# Patient Record
Sex: Male | Born: 1995 | Race: White | Hispanic: No | Smoking: Never smoker
Health system: Southern US, Community
[De-identification: ages and names within clinical notes are randomized; demographics above are authoritative.]

---

## 2015-12-08 ENCOUNTER — Emergency Department (HOSPITAL_COMMUNITY)
Admission: EM | Admit: 2015-12-08 | Discharge: 2015-12-08 | Disposition: A | Discharge status: Home / Self Care | Payer: Self-pay | Attending: Emergency Medicine | Admitting: Emergency Medicine

## 2015-12-08 ENCOUNTER — Encounter (HOSPITAL_COMMUNITY): Payer: Self-pay

## 2015-12-08 DIAGNOSIS — J039 Acute tonsillitis, unspecified: Secondary | ICD-10-CM | POA: Insufficient documentation

## 2015-12-08 MED ORDER — AMOXICILLIN 500 MG PO CAPS
500.0000 mg | ORAL_CAPSULE | Freq: Three times a day (TID) | ORAL | Status: DC
Start: 2015-12-08 — End: 2016-08-07

## 2015-12-08 MED ORDER — MAGIC MOUTHWASH W/LIDOCAINE: 5.0000 mL | Freq: Three times a day (TID) | ORAL | Status: DC | PRN

## 2015-12-08 NOTE — ED Notes (Signed)
Pt alert & oriented x4, stable gait. Patient given discharge instructions, paperwork & prescription(s). Patient  instructed to stop at the registration desk to finish any additional paperwork. Patient verbalized understanding. Pt left department w/ no further questions. 

## 2015-12-08 NOTE — ED Notes (Signed)
Pt reports has had a knot in his throat for the past 2 or 3 days that is painful with swallowing.  Denies fever.

## 2015-12-08 NOTE — Discharge Instructions (Signed)
Tonsillitis °Tonsillitis is an infection of the throat. This infection causes the tonsils to become red, tender, and puffy (swollen). Tonsils are groups of tissue at the back of your throat. If bacteria caused your infection, antibiotic medicine will be given to you. Sometimes symptoms of tonsillitis can be relieved with the use of steroid medicine. If your tonsillitis is severe and happens often, you may need to get your tonsils removed (tonsillectomy). °HOME CARE  °· Rest and sleep often. °· Drink enough fluids to keep your pee (urine) clear or pale yellow. °· While your throat is sore, eat soft or liquid foods like: °¨ Soup. °¨ Ice cream. °¨ Instant breakfast drinks. °· Eat frozen ice pops. °· Gargle with a warm or cold liquid to help soothe the throat. Gargle with a water and salt mix. Mix 1/4 teaspoon of salt and 1/4 teaspoon of baking soda in 1 cup of water. °· Only take medicines as told by your doctor. °· If you are given medicines (antibiotics), take them as told. Finish them even if you start to feel better. °GET HELP IF: °· You have large, tender lumps in your neck. °· You have a rash. °· You cough up green, yellow-brown, or bloody fluid. °· You cannot swallow liquids or food for 24 hours. °· You notice that only one of your tonsils is swollen. °GET HELP RIGHT AWAY IF:  °· You throw up (vomit). °· You have a very bad headache. °· You have a stiff neck. °· You have chest pain. °· You have trouble breathing or swallowing. °· You have bad throat pain, drooling, or your voice changes. °· You have bad pain not helped by medicine. °· You cannot fully open your mouth. °· You have redness, puffiness, or bad pain in the neck. °· You have a fever. °MAKE SURE YOU:  °· Understand these instructions. °· Will watch your condition. °· Will get help right away if you are not doing well or get worse. °  °This information is not intended to replace advice given to you by your health care provider. Make sure you discuss any  questions you have with your health care provider. °  °Document Released: 04/15/2008 Document Revised: 11/02/2013 Document Reviewed: 04/16/2013 °Elsevier Interactive Patient Education ©2016 [employer] ° °

## 2015-12-08 NOTE — ED Provider Notes (Signed)
CSN: [id number]     Arrival date & time 12/08/15  1241 History   First MD Initiated Contact with Patient 12/08/15 1249     Chief Complaint  Patient presents with  . lump in throat      (Consider location/radiation/quality/duration/timing/severity/associated sxs/prior Treatment) HPI   Steven Woodward is a 20 y.o. male who presents to the Emergency Department complaining of planing of sore throat and "a knot on the side of my right throat."  He states symptoms have been present for 2 days. He states he only has pain with swallowing and trying to eat food.  He denies ear pain, cough or nasal congestion, abdominal pain, chills or fever. He has not tried any medications for symptom relief. He denies known strep exposure.   History reviewed. No pertinent past medical history. History reviewed. No pertinent past surgical history. No family history on file. Social History  Substance Use Topics  . Smoking status: Never Smoker   . Smokeless tobacco: None  . Alcohol Use: No    Review of Systems  Constitutional: Negative for fever, chills, activity change and appetite change.  HENT: Positive for sore throat. Negative for congestion, ear pain, facial swelling, trouble swallowing and voice change.   Eyes: Negative for pain and visual disturbance.  Respiratory: Negative for cough and shortness of breath.   Gastrointestinal: Negative for nausea, vomiting and abdominal pain.  Musculoskeletal: Negative for arthralgias, neck pain and neck stiffness.  Skin: Negative for color change and rash.  Neurological: Negative for dizziness, facial asymmetry, speech difficulty, numbness and headaches.  Hematological: Positive for adenopathy.  All other systems reviewed and are negative.     Allergies  Review of patient's allergies indicates no known allergies.  Home Medications   Prior to Admission medications   Medication Sig Start Date End Date Taking? Authorizing Provider  amoxicillin (AMOXIL) 500  MG capsule Take 1 capsule (500 mg total) by mouth 3 (three) times daily. 12/08/15   Ranard Harte, PA-C  magic mouthwash w/lidocaine SOLN Take 5 mLs by mouth 3 (three) times daily as needed for mouth pain. Swish and spit, do not swallow 12/08/15   Stewart Sasaki, PA-C   Pulse 88  Temp(Src) 97.6 F (36.4 C) (Oral)  Resp 12  Ht  (1.778 m)  Wt 54.432 kg  BMI 17.22 kg/m2  SpO2 100% Physical Exam  Constitutional: He is oriented to person, place, and time. He appears well-developed and well-nourished. No distress.  HENT:  Head: Normocephalic and atraumatic.  Right Ear: Tympanic membrane and ear canal normal.  Left Ear: Tympanic membrane and ear canal normal.  Mouth/Throat: Uvula is midline and mucous membranes are normal. No trismus in the jaw. No uvula swelling. Oropharyngeal exudate and posterior oropharyngeal erythema present. No posterior oropharyngeal edema or tonsillar abscesses.  Uvula is midline, no edema or bulging of the soft palate.  Neck: Normal range of motion. Neck supple.  Cardiovascular: Normal rate, regular rhythm and normal heart sounds.   No murmur heard. Pulmonary/Chest: Effort normal and breath sounds normal.  Abdominal: Soft. There is no splenomegaly. There is no tenderness.  Musculoskeletal: Normal range of motion.  Lymphadenopathy:    He has cervical adenopathy.  Neurological: He is alert and oriented to person, place, and time. He exhibits normal muscle tone. Coordination normal.  Skin: Skin is warm and dry.  Nursing note and vitals reviewed.   ED Course  Procedures (including critical care time) Labs Review Labs Reviewed - No data to display  Imaging Review  No results found. I have personally reviewed and evaluated these images and lab results as part of my medical decision-making.   EKG Interpretation None      MDM   Final diagnoses:  Tonsillitis    Patient is well-appearing. Vital signs are stable. Airway is patent without edema. He  handles secretions well. There are exudates present on the right tonsil with right cervical lymphadenopathy. No concerning symptoms for peritonsillar abscess.  Symptoms are likely related to tonsillitis, he agrees to treatment with amoxicillin and magic mouthwash. Advised to return for any worsening symptoms, he appears stable for discharge.    Pauline Aus, PA-C 12/08/15 1647  Benjiman Core, MD 12/11/15 [phone]

## 2016-08-07 ENCOUNTER — Emergency Department (HOSPITAL_COMMUNITY): Payer: Self-pay

## 2016-08-07 ENCOUNTER — Emergency Department (HOSPITAL_COMMUNITY)
Admission: EM | Admit: 2016-08-07 | Discharge: 2016-08-07 | Disposition: A | Discharge status: Home / Self Care | Payer: Self-pay | Attending: Emergency Medicine | Admitting: Emergency Medicine

## 2016-08-07 ENCOUNTER — Encounter (HOSPITAL_COMMUNITY): Payer: Self-pay

## 2016-08-07 DIAGNOSIS — N503 Cyst of epididymis: Secondary | ICD-10-CM | POA: Insufficient documentation

## 2016-08-07 DIAGNOSIS — N50812 Left testicular pain: Secondary | ICD-10-CM | POA: Insufficient documentation

## 2016-08-07 LAB — URINALYSIS, ROUTINE W REFLEX MICROSCOPIC
BILIRUBIN URINE: NEGATIVE
GLUCOSE, UA: NEGATIVE mg/dL
HGB URINE DIPSTICK: NEGATIVE
KETONES UR: NEGATIVE mg/dL
LEUKOCYTES UA: NEGATIVE
Nitrite: NEGATIVE
PH: 6 (ref 5.0–8.0)
PROTEIN: NEGATIVE mg/dL
Specific Gravity, Urine: 1.02 (ref 1.005–1.030)

## 2016-08-07 NOTE — ED Notes (Signed)
US aware of pt. Will be her in about 20/25 minutes.

## 2016-08-07 NOTE — ED Provider Notes (Signed)
[facility] Provider Note   CSN: [id number] Arrival date & time: 08/07/16  1825     History   Chief Complaint Chief Complaint  Patient presents with  . Testicle Pain    HPI Steven Woodward is a 20 y.o. male.  HPI 20 year old male who presents with presents with left testicular pain. Pain ongoing for one month, comes and goes. Typically aching/throbbing in left testicle lasting 1-10 minutes. No aggravating or alleviating factors. No inciting factors. No associated with dysuria, hematuria, abnormal penile discharge, abd pain, nausea or vomiting. No trauma. Not associated with position changes. Has not tried medications for this.   History reviewed. No pertinent past medical history.  There are no active problems to display for this patient.   History reviewed. No pertinent surgical history.     Home Medications    Prior to Admission medications   Not on File    Family History No family history on file. Reviewed. noncontributory Social History Social History  Substance Use Topics  . Smoking status: Never Smoker  . Smokeless tobacco: Never Used  . Alcohol use No     Allergies   Review of patient's allergies indicates no known allergies.   Review of Systems Review of Systems 10/14 systems reviewed and are negative other than those stated in the HPI   Physical Exam Updated Vital Signs BP 142/81 (BP Location: Left Arm)   Pulse 87   Temp 98.2 F (36.8 C) (Oral)   Resp 20   Ht 5\' 10"  (1.778 m)   Wt 125 lb (56.7 kg)   SpO2 100%   BMI 17.94 kg/m   Physical Exam Physical Exam  Nursing note and vitals reviewed. Constitutional: Well developed, well nourished, non-toxic, and in no acute distress Head: Normocephalic and atraumatic.  Mouth/Throat: Oropharynx is clear and moist.  Neck: Normal range of motion. Neck supple.  Cardiovascular: Normal rate and regular rhythm.   Pulmonary/Chest: Effort normal and breath sounds normal.  Abdominal: Soft.  There is no tenderness. There is no rebound and no guarding.  GU: normal testicular lie w/o swelling, normal cremsteric reflexes bilaterally, no lesions or penile discharge Musculoskeletal: Normal range of motion.  Neurological: Alert, no facial droop, fluent speech, moves all extremities symmetrically Skin: Skin is warm and dry.  Psychiatric: Cooperative   ED Treatments / Results  Labs (all labs ordered are listed, but only abnormal results are displayed) Labs Reviewed  URINALYSIS, ROUTINE W REFLEX MICROSCOPIC ([facility])    EKG  EKG Interpretation None       Radiology [place] Scrotum  Result Date: 08/07/2016 CLINICAL DATA:  Intermittent left scrotal pain over the last 1 month. EXAM: ULTRASOUND OF SCROTUM TECHNIQUE: Complete ultrasound examination of the testicles, epididymis, and other scrotal structures was performed. COMPARISON:  None. FINDINGS: Right testicle Measurements: 4.3 x 1.8 x 3.1 cm, within normal limits. No mass or microlithiasis visualized. Left testicle Measurements: 4.1 x 2.5 x 2.3 cm, within normal limits. No mass or microlithiasis visualized. Right epididymis:  Normal in size and appearance. Left epididymis: A benign-appearing epididymal cyst measures 1.2 cm maximally. The left epididymis is otherwise within normal limits. Hydrocele:  None visualized. Varicocele:  None visualized. IMPRESSION: Negative. No evidence for testicular mass or other significant abnormality. Benign-appearing left epididymal cyst versus spermatocele. This is unlikely to be of clinical consequence to the patient. Electronically Signed   By: Marin Robertshristopher  Mattern M.D.   On: 08/07/2016 20:13   [place] Art/ven Flow Abd Pelv Doppler  Result  Date: 08/07/2016 CLINICAL DATA:  Intermittent left scrotal pain over the last 1 month. EXAM: ULTRASOUND OF SCROTUM TECHNIQUE: Complete ultrasound examination of the testicles, epididymis, and other scrotal structures was performed. COMPARISON:  None. FINDINGS: Right  testicle Measurements: 4.3 x 1.8 x 3.1 cm, within normal limits. No mass or microlithiasis visualized. Left testicle Measurements: 4.1 x 2.5 x 2.3 cm, within normal limits. No mass or microlithiasis visualized. Right epididymis:  Normal in size and appearance. Left epididymis: A benign-appearing epididymal cyst measures 1.2 cm maximally. The left epididymis is otherwise within normal limits. Hydrocele:  None visualized. Varicocele:  None visualized. IMPRESSION: Negative. No evidence for testicular mass or other significant abnormality. Benign-appearing left epididymal cyst versus spermatocele. This is unlikely to be of clinical consequence to the patient. Electronically Signed   By: Marin Roberts M.D.   On: 08/07/2016 20:13    Procedures Procedures (including critical care time)  Medications Ordered in ED Medications - No data to display   Initial Impression / Assessment and Plan / ED Course  I have reviewed the triage vital signs and the nursing notes.  Pertinent labs & imaging results that were available during my care of the patient were reviewed by me and considered in my medical decision making (see chart for details).  Clinical Course    Presenting with intermittent left testicular pain for 1 month. Currently symptomatic. With normal GU exam and benign abdomen. Vital signs within normal limits. He has normal urine. Ultrasound with Doppler reveals left epididymal cyst. There is no evidence of epididymitis, torsion, gastric, or any other serious processes. Given urology follow-up as needed. Strict return and follow-up instructions reviewed. He expressed understanding of all discharge instructions and felt comfortable with the plan of care.   Final Clinical Impressions(s) / ED Diagnoses   Final diagnoses:  Left testicular pain  Epididymal cyst    New Prescriptions New Prescriptions   No medications on file     Lavera Guise, MD 08/07/16 2022

## 2016-08-07 NOTE — ED Triage Notes (Signed)
Patient reports of left testicle pain x1 month. States he was looking on internet and decided to get check. Denies new pain or change. Denies discoloration.

## 2016-08-07 NOTE — Discharge Instructions (Signed)
You have a cyst on the left testicle. Please follow-up with urology for persistent symptoms. Return to Ed without fail for worsening symptoms, including escalating or severe pain, scrotal swelling or discoloration, inability to urinate or any other symptoms concerning to you.

## 2017-02-06 ENCOUNTER — Emergency Department (HOSPITAL_COMMUNITY): Payer: Self-pay

## 2017-02-06 ENCOUNTER — Encounter (HOSPITAL_COMMUNITY): Payer: Self-pay

## 2017-02-06 ENCOUNTER — Emergency Department (HOSPITAL_COMMUNITY)
Admission: EM | Admit: 2017-02-06 | Discharge: 2017-02-06 | Disposition: A | Discharge status: Home / Self Care | Payer: Self-pay | Attending: Emergency Medicine | Admitting: Emergency Medicine

## 2017-02-06 DIAGNOSIS — M654 Radial styloid tenosynovitis [de Quervain]: Secondary | ICD-10-CM | POA: Insufficient documentation

## 2017-02-06 NOTE — Discharge Instructions (Signed)
Wear the brace for at least one week.  Take ibuprofen 600 mg 3 times a day for 5-7 days.  Call the orthopedic doctor listed to arrange a follow-up appt in one week if not improving

## 2017-02-06 NOTE — ED Triage Notes (Signed)
Pt complaining of right wrsit pain for 5 days. No swelling. Complains of pain with movement of thumb. 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

## 2017-02-06 NOTE — ED Provider Notes (Signed)
[facility] Provider Note   CSN: [id number] Arrival date & time: 02/06/17  1705     History   Chief Complaint Chief Complaint  Patient presents with  . Wrist Pain    HPI Steven Woodward is a 21 y.o. male.  HPI   Steven Woodward is a 21 y.o. male who presents to the Emergency Department complaining of right wrist pain for 5 days.  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  He reports pain with wrist movement and hearing a "crunching" sound when he moves his thumb.  He denies trauma, numbness, swelling, redness or pain proximal to the wrist.     History reviewed. No pertinent past medical history.  There are no active problems to display for this patient.   History reviewed. No pertinent surgical history.     Home Medications    Prior to Admission medications   Not on File    Family History No family history on file.  Social History Social History  Substance Use Topics  . Smoking status: Never Smoker  . Smokeless tobacco: Never Used  . Alcohol use No     Allergies   Patient has no known allergies.   Review of Systems Review of Systems  Constitutional: Negative for chills and fever.  Musculoskeletal: Positive for arthralgias (right wrist pain). Negative for joint swelling and neck pain.  Skin: Negative for color change and wound.  Neurological: Negative for weakness and numbness.  All other systems reviewed and are negative.    Physical Exam Updated Vital Signs BP 127/78 (BP Location: Right Arm)   Pulse 90   Temp 97.9 F (36.6 C) (Oral)   Resp 16   Ht 5\' 10"  (1.778 m)   Wt 63.5 kg   SpO2 98%   BMI 20.09 kg/m   Physical Exam  Constitutional: He is oriented to person, place, and time. He appears well-developed and well-nourished. No distress.  HENT:  Head: Normocephalic and atraumatic.  Cardiovascular: Normal rate, regular rhythm and normal heart sounds.   Pulmonary/Chest: Effort normal and  breath sounds normal.  Musculoskeletal: He exhibits tenderness. He exhibits no edema.  ttp of the radial aspect of distal right wrist.  Mild crepitus on ROM of the thumb without pain. No anatomical snuffbox tenderness.  Radial pulse is brisk, distal sensation intact.  CR< 2 sec.  No bony deformity.  Patient has full ROM. Compartments soft. No skin changes  Neurological: He is alert and oriented to person, place, and time. He exhibits normal muscle tone. Coordination normal.  Skin: Skin is warm and dry.  Nursing note and vitals reviewed.    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 Initial encounter. EXAM: RIGHT WRIST - COMPLETE 3+ VIEW COMPARISON:  None. FINDINGS: There is no evidence of fracture or dislocation. There is no evidence of arthropathy or other focal bone abnormality. Soft tissues are unremarkable. IMPRESSION: Negative. Electronically Signed   By: Sebastian Ache M.D.   On: 02/06/2017 17:40    Procedures Procedures (including critical care time)  Medications Ordered in ED Medications - No data to display   Initial Impression / Assessment and Plan / ED Course  I have reviewed the triage vital signs and the nursing notes.  Pertinent labs & imaging results that were available during my care of the patient were reviewed by me and considered in my medical decision making (see chart for details).     Pt with wrist pain after excessive use.  No skin changes, NV intact.  No motor deficits. Doubt infectious process.  XR reassuring.    Pt agrees to RICE therapy, pt prefers to take OTC ibuprofen.  referral given to orthopedic if needed.   SPLINT APPLICATION Date/Time: 5:52 PM Authorized by:  Maxwell CaulRIPLETT,Cindie Rajagopalan L. Consent: Verbal consent obtained. Risks and benefits: risks, benefits and alternatives were discussed Consent given by: patient Splint applied by: nursing staff Location details: distal right wrist Splint type: Velcro cock up splint  Post-procedure: The splinted body part was neurovascularly unchanged following the procedure. Patient tolerance: Patient tolerated the procedure well with no immediate complications.     Final Clinical Impressions(s) / ED Diagnoses   Final diagnoses:  Tendinitis, de Quervain's    New Prescriptions New Prescriptions   No medications on file     Pauline Ausammy Lashanta Elbe, PA-C 02/06/17 1804    Vanetta MuldersScott Zackowski, MD 02/07/17 [phone]

## 2017-06-30 ENCOUNTER — Encounter (HOSPITAL_COMMUNITY): Payer: Self-pay | Admitting: Emergency Medicine

## 2017-06-30 ENCOUNTER — Emergency Department (HOSPITAL_COMMUNITY)
Admission: EM | Admit: 2017-06-30 | Discharge: 2017-06-30 | Disposition: A | Discharge status: Home / Self Care | Payer: Self-pay | Attending: Emergency Medicine | Admitting: Emergency Medicine

## 2017-06-30 DIAGNOSIS — L309 Dermatitis, unspecified: Secondary | ICD-10-CM

## 2017-06-30 DIAGNOSIS — J029 Acute pharyngitis, unspecified: Secondary | ICD-10-CM

## 2017-06-30 MED ORDER — AMOXICILLIN 500 MG PO CAPS: 500.0000 mg | ORAL_CAPSULE | Freq: Three times a day (TID) | ORAL | 0 refills | Status: DC

## 2017-06-30 MED ORDER — TRIAMCINOLONE ACETONIDE 0.1 % EX CREA: 1.0000 "application " | TOPICAL_CREAM | Freq: Three times a day (TID) | CUTANEOUS | 0 refills | Status: AC

## 2017-06-30 NOTE — Discharge Instructions (Signed)
Tylenol or ibuprofen if needed for fever or pain.  Drink plenty of fluids.  Follow-up with your doctor or return here if needed.

## 2017-06-30 NOTE — ED Triage Notes (Signed)
Patient complaining of sore throat x 3 days and rash to left lower leg for over a month.

## 2017-07-03 NOTE — ED Provider Notes (Signed)
[facility] Provider Note   CSN: [id number] Arrival date & time: 06/30/17  1631     History   Chief Complaint Chief Complaint  Patient presents with  . Sore Throat  . Rash    HPI Steven Woodward is a 21 y.o. male.  HPI   Steven Woodward is a 21 y.o. male who presents to the Emergency Department complaining of sore throat for 3 days.  Describes pain associated with swallowing.  Denies fever, cough, congestion.  Has been taking tylenol with some relief.  Also complains of a persistent rash to left lower leg for one month.  Rash is associated with itching and a scaly texture.  No pain or swelling.  History reviewed. No pertinent past medical history.  There are no active problems to display for this patient.   History reviewed. No pertinent surgical history.     Home Medications    Prior to Admission medications   Medication Sig Start Date End Date Taking? Authorizing Provider  amoxicillin (AMOXIL) 500 MG capsule Take 1 capsule (500 mg total) by mouth 3 (three) times daily. 06/30/17   Zerline Melchior, PA-C  triamcinolone cream (KENALOG) 0.1 % Apply 1 application topically 3 (three) times daily. 06/30/17   Pauline Aus, PA-C    Family History History reviewed. No pertinent family history.  Social History Social History  Substance Use Topics  . Smoking status: Never Smoker  . Smokeless tobacco: Never Used  . Alcohol use No     Allergies   Patient has no known allergies.   Review of Systems Review of Systems  Constitutional: Negative for activity change, appetite change, chills and fever.  HENT: Positive for sore throat. Negative for congestion, ear pain, facial swelling, trouble swallowing and voice change.   Eyes: Negative for pain and visual disturbance.  Respiratory: Negative for cough and shortness of breath.   Gastrointestinal: Negative for abdominal pain, nausea and vomiting.  Musculoskeletal: Negative for arthralgias, neck pain and neck stiffness.   Skin: Positive for rash (left lower leg). Negative for color change.  Neurological: Negative for dizziness, facial asymmetry, speech difficulty, numbness and headaches.  Hematological: Negative for adenopathy.  All other systems reviewed and are negative.    Physical Exam Updated Vital Signs BP 125/72 (BP Location: Right Arm)   Pulse 83   Temp 98.2 F (36.8 C) (Oral)   Resp 16   Ht 5\' 9"  (1.753 m)   Wt 63.5 kg (140 lb)   SpO2 100%   BMI 20.67 kg/m   Physical Exam  Constitutional: He is oriented to person, place, and time. He appears well-developed and well-nourished. No distress.  HENT:  Head: Normocephalic and atraumatic.  Right Ear: Tympanic membrane and ear canal normal.  Left Ear: Tympanic membrane and ear canal normal.  Mouth/Throat: Uvula is midline and mucous membranes are normal. No trismus in the jaw. No uvula swelling. Posterior oropharyngeal erythema present. No oropharyngeal exudate, posterior oropharyngeal edema or tonsillar abscesses. Tonsillar exudate.  Neck: Normal range of motion. Neck supple.  Cardiovascular: Normal rate, regular rhythm and normal heart sounds.   Pulmonary/Chest: Effort normal and breath sounds normal.  Abdominal: There is no splenomegaly. There is no tenderness.  Musculoskeletal: Normal range of motion.  Lymphadenopathy:    He has no cervical adenopathy.  Neurological: He is alert and oriented to person, place, and time. He exhibits normal muscle tone. Coordination normal.  Skin: Skin is warm and dry. Rash noted.  Focal, scaly erythematous rash to the lateral left lower  leg.  No edema.  Few excoriations.    Nursing note and vitals reviewed.    ED Treatments / Results  Labs (all labs ordered are listed, but only abnormal results are displayed) Labs Reviewed - No data to display  EKG  EKG Interpretation None       Radiology No results found.  Procedures Procedures (including critical care time)  Medications Ordered in  ED Medications - No data to display   Initial Impression / Assessment and Plan / ED Course  I have reviewed the triage vital signs and the nursing notes.  Pertinent labs & imaging results that were available during my care of the patient were reviewed by me and considered in my medical decision making (see chart for details).     Pt well appearing.  Mo fever.  Rash likely dermatitis and focal  Rx amoxil and steroid cream.  appears stable for d/c  Final Clinical Impressions(s) / ED Diagnoses   Final diagnoses:  Sore throat  Dermatitis    New Prescriptions Discharge Medication List as of 06/30/2017  5:32 PM    START taking these medications   Details  amoxicillin (AMOXIL) 500 MG capsule Take 1 capsule (500 mg total) by mouth 3 (three) times daily., Starting Mon 06/30/2017, Print    triamcinolone cream (KENALOG) 0.1 % Apply 1 application topically 3 (three) times daily., Starting Mon 06/30/2017, [address] [place], PA-C 07/03/17 1550    Linwood Dibbles, MD 07/06/17 1755

## 2017-10-03 ENCOUNTER — Emergency Department (HOSPITAL_COMMUNITY)
Admission: EM | Admit: 2017-10-03 | Discharge: 2017-10-03 | Disposition: A | Discharge status: Home / Self Care | Payer: Self-pay | Attending: Emergency Medicine | Admitting: Emergency Medicine

## 2017-10-03 ENCOUNTER — Emergency Department (HOSPITAL_COMMUNITY): Payer: Self-pay

## 2017-10-03 ENCOUNTER — Encounter (HOSPITAL_COMMUNITY): Payer: Self-pay

## 2017-10-03 ENCOUNTER — Other Ambulatory Visit: Payer: Self-pay

## 2017-10-03 DIAGNOSIS — R0781 Pleurodynia: Secondary | ICD-10-CM

## 2017-10-03 DIAGNOSIS — R0789 Other chest pain: Secondary | ICD-10-CM | POA: Insufficient documentation

## 2017-10-03 NOTE — ED Provider Notes (Signed)
[facility] EMERGENCY DEPARTMENT Provider Note   CSN: [id number] Arrival date & time: 10/03/17  0108  Time seen 01:52 AM   History   Chief Complaint Chief Complaint  Patient presents with  . Chest Pain    rib pain    HPI Roxy HorsemanBrett Mermelstein is a 21 y.o. male.  HPI when I enter the room and asked the patient what happened tonight he states "nothing".  He then relates to me [place] and about 12:10 AM while doing nothing he had acute onset of right lower chest pain that was very pleuritic.  He states it was there constantly.  Deep breathing and movement made the pain worse, sitting still made it better.  He states while he was in x-ray the pain went away and states it lasted about an hour.  He has no pain at all right now.  He states he had similar pain earlier in the year that only lasted about 5 minutes and he did not seek medical evaluation for.  He denies cough or fever.  PCP none  History reviewed. No pertinent past medical history.  There are no active problems to display for this patient.   History reviewed. No pertinent surgical history.     Home Medications    Prior to Admission medications   Medication Sig Start Date End Date Taking? Authorizing Provider  amoxicillin (AMOXIL) 500 MG capsule Take 1 capsule (500 mg total) by mouth 3 (three) times daily. 06/30/17  Yes Triplett, Tammy, PA-C  triamcinolone cream (KENALOG) 0.1 % Apply 1 application topically 3 (three) times daily. 06/30/17  Yes Triplett, Tammy, PA-C    Family History No family history on file.  Social History Social History   Tobacco Use  . Smoking status: Never Smoker  . Smokeless tobacco: Never Used  Substance Use Topics  . Alcohol use: No  . Drug use: No  employed   Allergies   Patient has no known allergies.   Review of Systems Review of Systems  All other systems reviewed and are negative.    Physical Exam Updated Vital Signs BP 100/71 (BP Location: Right  Arm)   Pulse 87   Temp 97.8 F (36.6 C) (Oral)   Resp 15   Ht 5\' 9"  (1.753 m)   Wt 63.5 kg (140 lb)   SpO2 99%   BMI 20.67 kg/m   Vital signs normal    Physical Exam  Constitutional: He is oriented to person, place, and time. He appears well-developed and well-nourished.  Non-toxic appearance. He does not appear ill. No distress.  HENT:  Head: Normocephalic and atraumatic.  Right Ear: External ear normal.  Left Ear: External ear normal.  Nose: Nose normal. No mucosal edema or rhinorrhea.  Mouth/Throat: Oropharynx is clear and moist and mucous membranes are normal. No dental abscesses or uvula swelling.  Eyes: Conjunctivae and EOM are normal. Pupils are equal, round, and reactive to light.  Neck: Normal range of motion and full passive range of motion without pain. Neck supple.  Cardiovascular: Normal rate, regular rhythm and normal heart sounds. Exam reveals no gallop and no friction rub.  No murmur heard. Pulmonary/Chest: Effort normal and breath sounds normal. No respiratory distress. He has no wheezes. He has no rhonchi. He has no rales. He exhibits no tenderness and no crepitus.  Area of pain noted, nontender and not present now    Abdominal: Soft. Normal appearance and bowel sounds are normal. He exhibits no distension. There is  no tenderness. There is no rebound and no guarding.  Musculoskeletal: Normal range of motion. He exhibits no edema or tenderness.  Moves all extremities well.   Neurological: He is alert and oriented to person, place, and time. He has normal strength. No cranial nerve deficit.  Skin: Skin is warm, dry and intact. No rash noted. No erythema. No pallor.  Psychiatric: He has a normal mood and affect. His speech is normal and behavior is normal. His mood appears not anxious.  Nursing note and vitals reviewed.    ED Treatments / Results  Labs (all labs ordered are listed, but only abnormal results are displayed) Labs Reviewed - No data to  display  EKG  EKG Interpretation None       Radiology Dg Ribs Unilateral W/chest Right  Result Date: 10/03/2017 CLINICAL DATA:  Sudden onset right lower anterior rib pain 1 hour ago. No injury. EXAM: RIGHT RIBS AND CHEST - 3+ VIEW COMPARISON:  None. FINDINGS: Normal heart size and pulmonary vascularity. No focal airspace disease or consolidation in the lungs. No blunting of costophrenic angles. No pneumothorax. Mediastinal contours appear intact. Right ribs appear intact. No acute fracture or displacement. No focal bone lesion or bone destruction. Soft tissues are unremarkable. IMPRESSION: No evidence of active pulmonary disease.  Negative right ribs. Electronically Signed   By: Burman NievesWilliam  Stevens M.D.   On: 10/03/2017 01:54    Procedures Procedures (including critical care time)  Medications Ordered in ED Medications - No data to display   Initial Impression / Assessment and Plan / ED Course  I have reviewed the triage vital signs and the nursing notes.  Pertinent labs & imaging results that were available during my care of the patient were reviewed by me and considered in my medical decision making (see chart for details).     We discussed his chest x-ray and I actually reviewed it and it is without acute findings.  His pain is currently gone.  He was advised if it comes back to take ibuprofen for pain and be rechecked if it seems worse such as fever or struggling to breathe.  Final Clinical Impressions(s) / ED Diagnoses   Final diagnoses:  Pleuritic chest pain    ED Discharge Orders    None    OTC ibuprofen  Plan discharge  Devoria AlbeIva Aliciana Ricciardi, MD, Concha PyoFACEP     Johathon Overturf, MD 10/03/17 [phone]

## 2017-10-03 NOTE — Discharge Instructions (Signed)
You can take ibuprofen 600 mg 4 times a day for pain if needed. Recheck if you get worse such as struggle to breathe, get a fever or seem worse.

## 2017-10-03 NOTE — ED Triage Notes (Signed)
Pt reports he was just sitting in his car when he had sudden onset of sharp pain to right ribcage.  Pt denies twisting, turning, or feeling a pop, sates this has happened before.  The pain is worse with movement and deep breathing, states is painful when he tries to sit down.

## 2018-03-05 IMAGING — DX DG RIBS W/ CHEST 3+V*R*
5 series · 5 of 5 positions shown · non-contrast
Comparison: None.

CLINICAL DATA: Sudden onset right lower anterior rib pain 1 hour
ago. No injury.

EXAM:
RIGHT RIBS AND CHEST - 3+ VIEW

[chest pa]
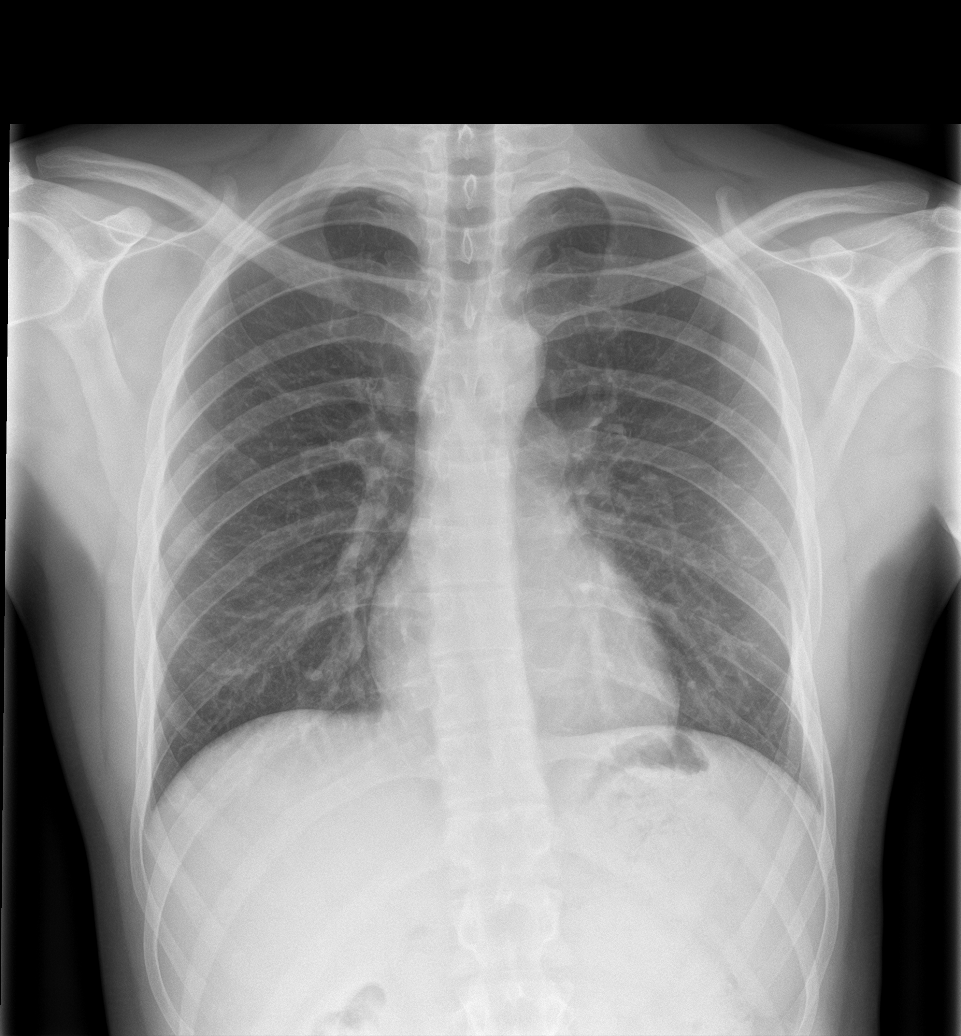

[rib pa (1 of 2)]
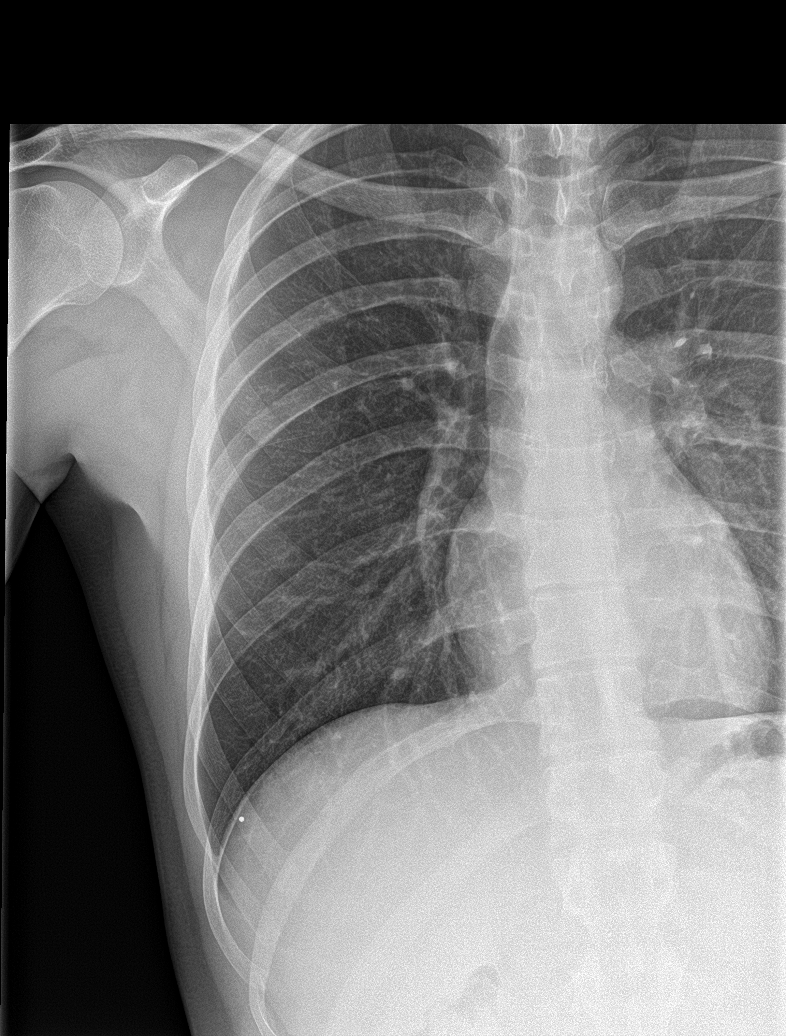

[rib pa obl (1 of 2)]
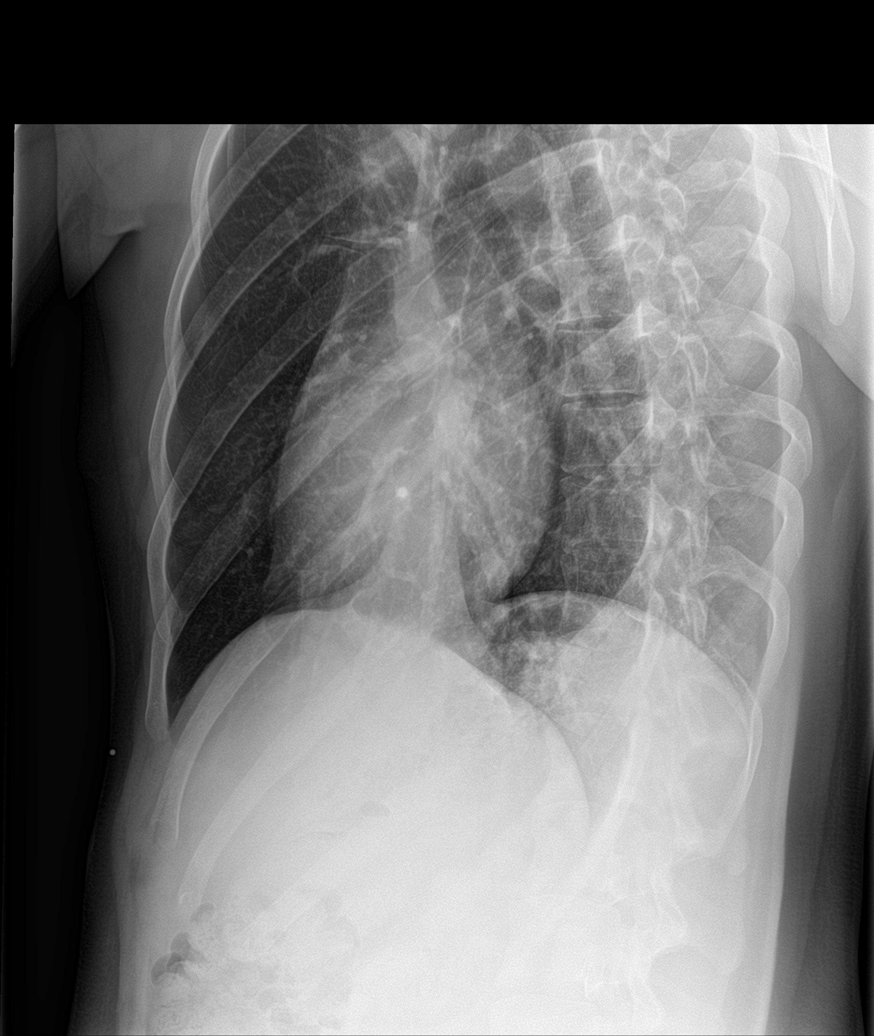

[rib pa obl (2 of 2)]
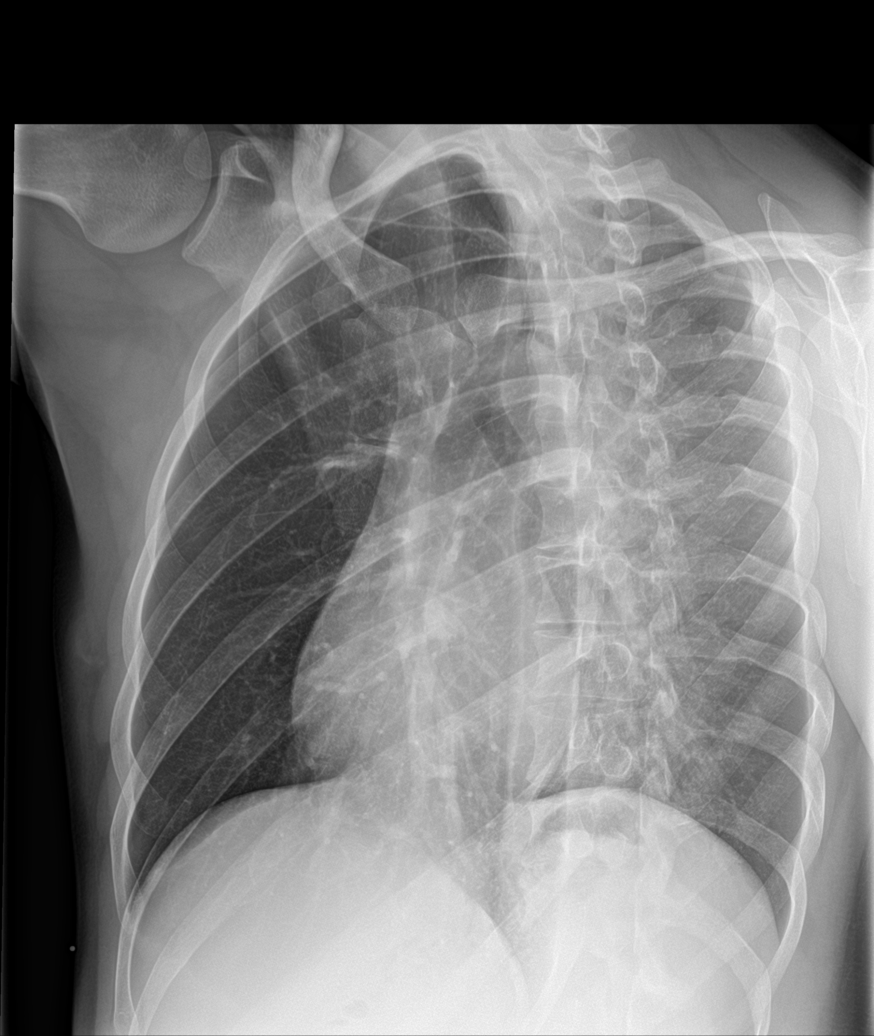

[rib pa (2 of 2)]
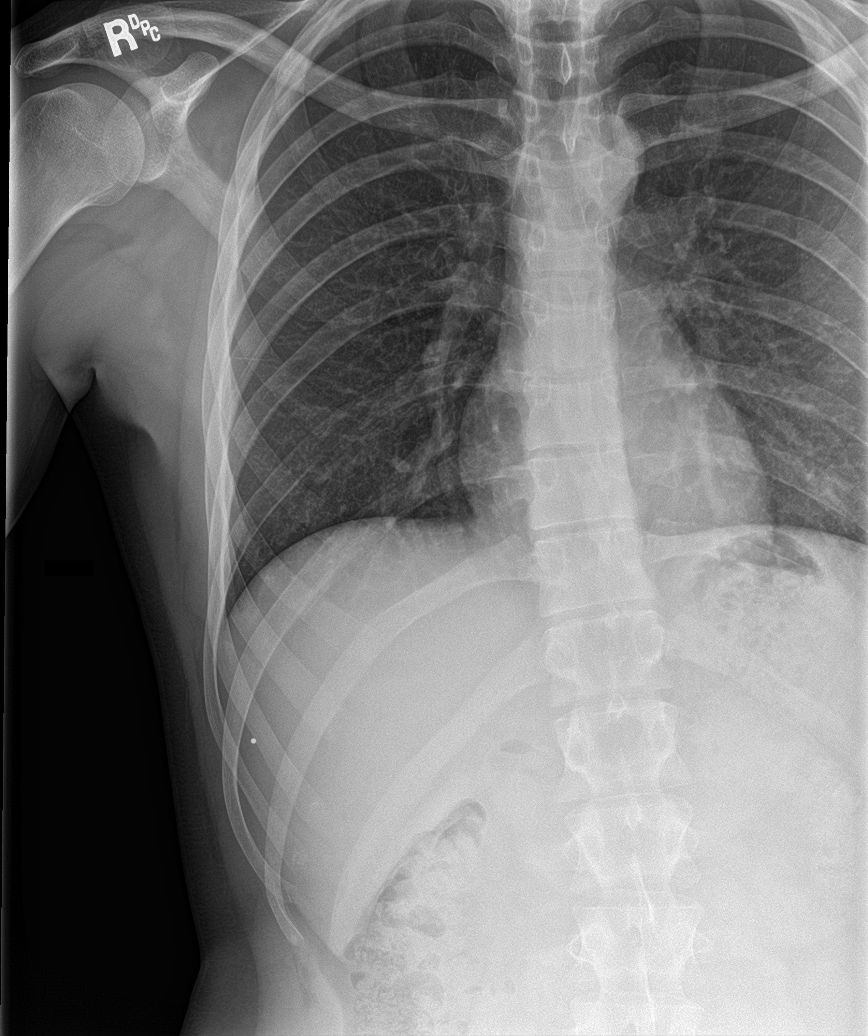

[5 of 5 positions shown; findings below may reference images not displayed]

FINDINGS: Normal heart size and pulmonary vascularity. No focal airspace
disease or consolidation in the lungs. No blunting of costophrenic
angles. No pneumothorax. Mediastinal contours appear intact.

Right ribs appear intact. No acute fracture or displacement. No
focal bone lesion or bone destruction. Soft tissues are
unremarkable.
IMPRESSION: No evidence of active pulmonary disease.  Negative right ribs.

## 2019-10-20 ENCOUNTER — Other Ambulatory Visit: Payer: Self-pay

## 2019-10-20 DIAGNOSIS — Z20822 Contact with and (suspected) exposure to covid-19: Secondary | ICD-10-CM

## 2019-10-22 LAB — NOVEL CORONAVIRUS, NAA: SARS-CoV-2, NAA: NOT DETECTED

## 2020-06-05 ENCOUNTER — Encounter: Payer: Self-pay | Admitting: Emergency Medicine

## 2020-06-05 ENCOUNTER — Ambulatory Visit
Admission: EM | Admit: 2020-06-05 | Discharge: 2020-06-05 | Disposition: A | Discharge status: Home / Self Care | Payer: BC Managed Care – PPO | Attending: Family Medicine | Admitting: Family Medicine

## 2020-06-05 ENCOUNTER — Other Ambulatory Visit: Payer: Self-pay

## 2020-06-05 DIAGNOSIS — M545 Low back pain, unspecified: Secondary | ICD-10-CM

## 2020-06-05 DIAGNOSIS — T148XXA Other injury of unspecified body region, initial encounter: Secondary | ICD-10-CM | POA: Diagnosis not present

## 2020-06-05 MED ORDER — IBUPROFEN 800 MG PO TABS: 800.0000 mg | ORAL_TABLET | Freq: Three times a day (TID) | ORAL | 0 refills | Status: AC | PRN

## 2020-06-05 MED ORDER — CYCLOBENZAPRINE HCL 10 MG PO TABS: 10.0000 mg | ORAL_TABLET | Freq: Two times a day (BID) | ORAL | 0 refills | Status: AC | PRN

## 2020-06-05 NOTE — Discharge Instructions (Signed)
Take the prescribed ibuprofen as needed for your pain.  Take the muscle relaxer Flexeril as needed for muscle spasm; Do not drive, operate machinery, or drink alcohol with this medication as it may make you drowsy.    You may take 2 extra strength tylenol with the ibuprofen if needed for pain  Follow up with your primary care provider or an orthopedist if your pain is not improving.

## 2020-06-05 NOTE — ED Triage Notes (Signed)
Mid back pain that started during work yesterday, reports he does heavy lifting at work.

## 2020-06-05 NOTE — ED Provider Notes (Signed)
[facility] CARE CENTER   [id number] 06/05/20 Arrival Time: 1601  EX:BMWUX PAIN  SUBJECTIVE: History from: patient. Steven Woodward is a 24 y.o. male complains of lower back pain that began yesterday while he was at work. Reports that he does heavy lifting at work. Reports that he was twisting with a heavy box in his hands and then he felt a pull in his back. Localizes the pain to the right side, mid-low back. Describes the pain as intermittent and achy in character. Has tried OTC medications without relief. Symptoms are made worse with activity. Denies similar symptoms in the past. Denies fever, chills, erythema, ecchymosis, effusion, weakness, numbness and tingling, saddle paresthesias, loss of bowel or bladder function.      ROS: As per HPI.  All other pertinent ROS negative.     History reviewed. No pertinent past medical history. History reviewed. No pertinent surgical history. No Known Allergies No current facility-administered medications on file prior to encounter.   Current Outpatient Medications on File Prior to Encounter  Medication Sig Dispense Refill  . amoxicillin (AMOXIL) 500 MG capsule Take 1 capsule (500 mg total) by mouth 3 (three) times daily. 30 capsule 0  . triamcinolone cream (KENALOG) 0.1 % Apply 1 application topically 3 (three) times daily. 30 g 0   Social History   Socioeconomic History  . Marital status: Significant Other    Spouse name: Not on file  . Number of children: Not on file  . Years of education: Not on file  . Highest education level: Not on file  Occupational History  . Not on file  Tobacco Use  . Smoking status: Never Smoker  . Smokeless tobacco: Never Used  Substance and Sexual Activity  . Alcohol use: No  . Drug use: No  . Sexual activity: Not on file  Other Topics Concern  . Not on file  Social History Narrative  . Not on file   Social Determinants of Health   Financial Resource Strain:   . Difficulty of Paying Living Expenses:     Food Insecurity:   . Worried About Programme researcher, broadcasting/film/video in the Last Year:   . Barista in the Last Year:   Transportation Needs:   . Freight forwarder (Medical):   Marland Kitchen Lack of Transportation (Non-Medical):   Physical Activity:   . Days of Exercise per Week:   . Minutes of Exercise per Session:   Stress:   . Feeling of Stress :   Social Connections:   . Frequency of Communication with Friends and Family:   . Frequency of Social Gatherings with Friends and Family:   . Attends Religious Services:   . Active Member of Clubs or Organizations:   . Attends Banker Meetings:   Marland Kitchen Marital Status:   Intimate Partner Violence:   . Fear of Current or Ex-Partner:   . Emotionally Abused:   Marland Kitchen Physically Abused:   . Sexually Abused:    No family history on file.  OBJECTIVE:  Vitals:   06/05/20 1614 06/05/20 1616  BP:  (!) 107/62  Pulse:  77  Resp:  17  Temp:  98.1 F (36.7 C)  TempSrc:  Oral  SpO2:  95%  Weight: 138 lb 14.2 oz (63 kg)   Height: 5\' 9"  (1.753 m)     General appearance: ALERT; in no acute distress.  Head: NCAT Lungs: Normal respiratory effort CV: pulses 2+ bilaterally. Cap refill < 2 seconds Musculoskeletal:  Inspection: Skin warm,  dry, clear and intact without obvious erythema, effusion, or ecchymosis.  Palpation: right side of mid back very tender to palpation ROM: limited ROM active and passive Skin: warm and dry Neurologic: Ambulates without difficulty; Sensation intact about the upper/ lower extremities Psychological: alert and cooperative; normal mood and affect  DIAGNOSTIC STUDIES:  No results found.   ASSESSMENT & PLAN:  1. Acute right-sided low back pain without sciatica   2. Muscle strain      Meds ordered this encounter  Medications  . ibuprofen (ADVIL) 800 MG tablet    Sig: Take 1 tablet (800 mg total) by mouth every 8 (eight) hours as needed for moderate pain.    Dispense:  21 tablet    Refill:  0    Order Specific  Question:   Supervising Provider    Answer:   Merrilee Jansky [id number]  . cyclobenzaprine (FLEXERIL) 10 MG tablet    Sig: Take 1 tablet (10 mg total) by mouth 2 (two) times daily as needed for muscle spasms.    Dispense:  20 tablet    Refill:  0    Order Specific Question:   Supervising Provider    Answer:   Merrilee Jansky [[id number]]   Prescribed ibuprofen Prescribed cyclobenzaprine May alternate heat and ice for comfort May use topical rubs as well Continue conservative management of rest, ice, and gentle stretches Take ibuprofen as needed for pain relief (may cause abdominal discomfort, ulcers, and GI bleeds avoid taking with other NSAIDs) Take cyclobenzaprine at nighttime for symptomatic relief. Avoid driving or operating heavy machinery while using medication. Follow up with PCP if symptoms persist Return or go to the ER if you have any new or worsening symptoms (fever, chills, chest pain, abdominal pain, changes in bowel or bladder habits, pain radiating into lower legs)   Reviewed expectations re: course of current medical issues. Questions answered. Outlined signs and symptoms indicating need for more acute intervention. Patient verbalized understanding. After Visit Summary given.       Moshe Cipro, NP 06/05/20 [phone]

## 2021-06-10 ENCOUNTER — Ambulatory Visit
Admission: EM | Admit: 2021-06-10 | Discharge: 2021-06-10 | Disposition: A | Discharge status: Home / Self Care | Payer: BC Managed Care – PPO | Attending: Emergency Medicine | Admitting: Emergency Medicine

## 2021-06-10 ENCOUNTER — Encounter: Payer: Self-pay | Admitting: Emergency Medicine

## 2021-06-10 DIAGNOSIS — R52 Pain, unspecified: Secondary | ICD-10-CM | POA: Diagnosis not present

## 2021-06-10 DIAGNOSIS — J028 Acute pharyngitis due to other specified organisms: Secondary | ICD-10-CM

## 2021-06-10 DIAGNOSIS — B9789 Other viral agents as the cause of diseases classified elsewhere: Secondary | ICD-10-CM | POA: Diagnosis not present

## 2021-06-10 DIAGNOSIS — R509 Fever, unspecified: Secondary | ICD-10-CM | POA: Insufficient documentation

## 2021-06-10 LAB — POCT RAPID STREP A (OFFICE): Rapid Strep A Screen: NEGATIVE

## 2021-06-10 NOTE — ED Provider Notes (Addendum)
[facility] CARE CENTER   [id number] 06/10/21 Arrival Time: 9798   CC: COVID symptoms  SUBJECTIVE: History from: patient.  Steven Woodward is a 25 y.o. male who presents to the urgent care for complaint of sore throat and body ache for the past 4 days.  He denies sick exposure to COVID, flu or strep.  Denies recent travel.  Has tried OTC medication  without relief.  Denies alleviating or aggravating factors.  Denies previous symptoms in the past.   Denies  fatigue, sinus pain, rhinorrhea, sore throat, SOB, wheezing, chest pain, nausea, changes in bowel or bladder habits.     ROS: As per HPI.  All other pertinent ROS negative.     History reviewed. No pertinent past medical history. History reviewed. No pertinent surgical history. No Known Allergies No current facility-administered medications on file prior to encounter.   Current Outpatient Medications on File Prior to Encounter  Medication Sig Dispense Refill   amoxicillin (AMOXIL) 500 MG capsule Take 1 capsule (500 mg total) by mouth 3 (three) times daily. 30 capsule 0   cyclobenzaprine (FLEXERIL) 10 MG tablet Take 1 tablet (10 mg total) by mouth 2 (two) times daily as needed for muscle spasms. 20 tablet 0   ibuprofen (ADVIL) 800 MG tablet Take 1 tablet (800 mg total) by mouth every 8 (eight) hours as needed for moderate pain. 21 tablet 0   triamcinolone cream (KENALOG) 0.1 % Apply 1 application topically 3 (three) times daily. 30 g 0   Social History   Socioeconomic History   Marital status: Significant Other    Spouse name: Not on file   Number of children: Not on file   Years of education: Not on file   Highest education level: Not on file  Occupational History   Not on file  Tobacco Use   Smoking status: Never   Smokeless tobacco: Never  Substance and Sexual Activity   Alcohol use: No   Drug use: No   Sexual activity: Not on file  Other Topics Concern   Not on file  Social History Narrative   Not on file   Social  Determinants of Health   Financial Resource Strain: Not on file  Food Insecurity: Not on file  Transportation Needs: Not on file  Physical Activity: Not on file  Stress: Not on file  Social Connections: Not on file  Intimate Partner Violence: Not on file   No family history on file.  OBJECTIVE:  Vitals:   06/10/21 0841  BP: 108/70  Pulse: (!) 102  Resp: 18  Temp: 99.2 F (37.3 C)  TempSrc: Oral  SpO2: 97%     General appearance: alert; appears fatigued, but nontoxic; speaking in full sentences and tolerating own secretions HEENT: NCAT; Ears: EACs clear, TMs pearly gray; Eyes: PERRL.  EOM grossly intact. Sinuses: nontender; Nose: nares patent without rhinorrhea, Throat: oropharynx clear, tonsils non erythematous or enlarged, uvula midline  Neck: supple without LAD Lungs: unlabored respirations, symmetrical air entry; cough: mild; no respiratory distress; CTAB Heart: regular rate and rhythm.  Radial pulses 2+ symmetrical bilaterally Skin: warm and dry Psychological: alert and cooperative; normal mood and affect  LABS:  Results for orders placed or performed during the hospital encounter of 06/10/21 (from the past 24 hour(s))  POCT rapid strep A     Status: None   Collection Time: 06/10/21  8:51 AM  Result Value Ref Range   Rapid Strep A Screen Negative Negative     ASSESSMENT & PLAN:  1. Fever, unspecified   2. Generalized body aches   3. Sore throat (viral)     No orders of the defined types were placed in this encounter.  Patient is stable at discharge.  He declined to be prescribed new covid oral medications.   Discharge instructions  Strep test was negative, sample will be sent for culture and someone will call if your result is positive.  COVID testing ordered.  It will take between 1-5 days for test results.  Someone will contact you regarding abnormal results.  May use OTC Cepacol, Halls or Vicks to soothe throat Continue OTC Tylenol/ibuprofen as  needed for pain and fever Use OTC medications like ibuprofen or tylenol as needed fever or pain Call or go to the ED if you have any new or worsening symptoms such as fever, worsening cough, shortness of breath, chest tightness, chest pain, turning blue, changes in mental status, etc...   Reviewed expectations re: course of current medical issues. Questions answered. Outlined signs and symptoms indicating need for more acute intervention. Patient verbalized understanding. After Visit Summary given.          Durward Parcel, FNP 06/10/21 0910    Durward Parcel, FNP 06/10/21 [phone]

## 2021-06-10 NOTE — Discharge Instructions (Addendum)
Strep test was negative, sample will be sent for culture and someone will call if your result is positive.  COVID testing ordered.  It will take between 1-5 days for test results.  Someone will contact you regarding abnormal results.  May use OTC Cepacol, Halls or Vicks to soothe throat Continue OTC Tylenol/ibuprofen as needed for pain and fever Use OTC medications like ibuprofen or tylenol as needed fever or pain Call or go to the ED if you have any new or worsening symptoms such as fever, worsening cough, shortness of breath, chest tightness, chest pain, turning blue, changes in mental status, etc..Marland Kitchen

## 2021-06-10 NOTE — ED Triage Notes (Signed)
Sore throat and body aches x 4 days

## 2021-06-12 LAB — COVID-19, FLU A+B NAA
Influenza A, NAA: NOT DETECTED
Influenza B, NAA: NOT DETECTED
SARS-CoV-2, NAA: DETECTED — AB

## 2021-06-13 LAB — CULTURE, GROUP A STREP (THRC)

## 2021-06-25 ENCOUNTER — Encounter: Payer: Self-pay | Admitting: Emergency Medicine

## 2021-06-25 ENCOUNTER — Other Ambulatory Visit: Payer: Self-pay

## 2021-06-25 ENCOUNTER — Ambulatory Visit
Admission: EM | Admit: 2021-06-25 | Discharge: 2021-06-25 | Disposition: A | Discharge status: Home / Self Care | Payer: BC Managed Care – PPO

## 2021-06-25 DIAGNOSIS — M62838 Other muscle spasm: Secondary | ICD-10-CM | POA: Diagnosis not present

## 2021-06-25 NOTE — ED Triage Notes (Signed)
Pt presents today with c/o of right arm and right leg tightening/cramping intermittently that began earlier this year. Pt reports last episode happened last night that lasted approx 2 min. +can cause headache with nausea.

## 2021-06-25 NOTE — ED Provider Notes (Signed)
[facility]    CSN: [id number] Arrival date & time: 06/25/21  4098      History   Chief Complaint Chief Complaint  Patient presents with   Leg Pain   Arm Pain    right    HPI Steven Woodward is a 25 y.o. male.   HPI Right and leg muscle tensed up for 1-2 minutes At the same time he experienced any nausea or headache.  The episode lasted for approximately 5 minutes and occurred last night.  Reports a similar episode in the past which did not last as long.  History reviewed. No pertinent past medical history.  There are no problems to display for this patient.   History reviewed. No pertinent surgical history.     Home Medications    Prior to Admission medications   Medication Sig Start Date End Date Taking? Authorizing Provider  amoxicillin (AMOXIL) 500 MG capsule Take 1 capsule (500 mg total) by mouth 3 (three) times daily. 06/30/17   Triplett, Tammy, PA-C  cyclobenzaprine (FLEXERIL) 10 MG tablet Take 1 tablet (10 mg total) by mouth 2 (two) times daily as needed for muscle spasms. 06/05/20   Moshe Cipro, NP  ibuprofen (ADVIL) 800 MG tablet Take 1 tablet (800 mg total) by mouth every 8 (eight) hours as needed for moderate pain. 06/05/20   Moshe Cipro, NP  triamcinolone cream (KENALOG) 0.1 % Apply 1 application topically 3 (three) times daily. 06/30/17   Pauline Aus, PA-C    Family History History reviewed. No pertinent family history.  Social History Social History   Tobacco Use   Smoking status: Never   Smokeless tobacco: Never  Substance Use Topics   Alcohol use: No   Drug use: No     Allergies   Patient has no known allergies.   Review of Systems Review of Systems Pertinent negatives listed in HPI   Physical Exam Triage Vital Signs ED Triage Vitals [06/25/21 1312]  Enc Vitals Group     BP 137/87     Pulse Rate 74     Resp 18     Temp 98.3 F (36.8 C)     Temp Source Oral     SpO2 98 %     Weight 140 lb (63.5  kg)     Height 5\' 10"  (1.778 m)     Head Circumference      Peak Flow      Pain Score 0     Pain Loc      Pain Edu?      Excl. in GC?    No data found.  Updated Vital Signs BP 137/87 (BP Location: Right Arm)   Pulse 74   Temp 98.3 F (36.8 C) (Oral)   Resp 18   Ht 5\' 10"  (1.778 m)   Wt 140 lb (63.5 kg)   SpO2 98%   BMI 20.09 kg/m   Visual Acuity Right Eye Distance:   Left Eye Distance:   Bilateral Distance:    Right Eye Near:   Left Eye Near:    Bilateral Near:     Physical Exam General appearance: Alert, well developed, well nourished, cooperative, no distress Head: Normocephalic, without obvious abnormality, atraumatic Respiratory: Respirations even and unlabored, normal respiratory rate Heart: Rate and rhythm normal. No gallop or murmurs noted on exam  Extremities: No gross deformities Skin: Skin color, texture, turgor normal. No rashes seen  Psych: Appropriate mood and affect. Neurologic: GCS 15, no coordination, normal gait, normal sensation.  UC Treatments / Results  Labs (all labs ordered are listed, but only abnormal results are displayed) Labs Reviewed - No data to display  EKG   Radiology No results found.  Procedures Procedures (including critical care time)  Medications Ordered in UC Medications - No data to display  Initial Impression / Assessment and Plan / UC Course  I have reviewed the triage vital signs and the nursing notes.  Pertinent labs & imaging results that were available during my care of the patient were reviewed by me and considered in my medical decision making (see chart for details).    Muscle spasm Encouraged to force fluids Work not provided. Overall exam finding benign for any focal neurological deficits Follow-up with PCP as needed ER precautions given Final Clinical Impressions(s) / UC Diagnoses   Final diagnoses:  Muscle spasm of right leg  Muscle spasm   Discharge Instructions   None    ED  Prescriptions   None    PDMP not reviewed this encounter.   Bing Neighbors, [place] 07/01/21 2043

## 2024-06-08 ENCOUNTER — Emergency Department (HOSPITAL_COMMUNITY)
Admission: EM | Admit: 2024-06-08 | Discharge: 2024-06-08 | Disposition: A | Discharge status: Home / Self Care | Payer: Self-pay | Attending: Emergency Medicine | Admitting: Emergency Medicine

## 2024-06-08 ENCOUNTER — Other Ambulatory Visit: Payer: Self-pay

## 2024-06-08 DIAGNOSIS — K047 Periapical abscess without sinus: Secondary | ICD-10-CM | POA: Insufficient documentation

## 2024-06-08 MED ORDER — CLINDAMYCIN HCL 150 MG PO CAPS: 300.0000 mg | ORAL_CAPSULE | Freq: Four times a day (QID) | ORAL | 0 refills | Status: AC

## 2024-06-08 MED ORDER — CLINDAMYCIN HCL 150 MG PO CAPS
300.0000 mg | ORAL_CAPSULE | Freq: Once | ORAL | Status: AC
  Administered 2024-06-08: 300 mg via ORAL
  Filled 2024-06-08: qty 2

## 2024-06-08 NOTE — ED Triage Notes (Signed)
 Pt c/o left sided facial swelling, states he just noticed it a few hours prior to arrival. Denies any dental pain.

## 2024-06-08 NOTE — ED Provider Notes (Signed)
   [place] [facility]  Provider Note  CSN: [id number] Arrival date & time: 06/08/24 0303  History Chief Complaint  Patient presents with   Facial Swelling    Steven Woodward is a 28 y.o. male reports about 2 hours of L facial swelling, no pain. He has been on PCN intermittently for the last two weeks for a toothache, but reports that pain has gone away. No fever or drainage. Does not have a dentist.    Home Medications Prior to Admission medications   Medication Sig Start Date End Date Taking? Authorizing Provider  clindamycin  (CLEOCIN ) 150 MG capsule Take 2 capsules (300 mg total) by mouth 4 (four) times daily. 06/08/24  Yes Roselyn Carlin NOVAK, MD  cyclobenzaprine  (FLEXERIL ) 10 MG tablet Take 1 tablet (10 mg total) by mouth 2 (two) times daily as needed for muscle spasms. 06/05/20   Alvia Corean CROME, FNP  ibuprofen  (ADVIL ) 800 MG tablet Take 1 tablet (800 mg total) by mouth every 8 (eight) hours as needed for moderate pain. 06/05/20   Alvia Corean CROME, FNP  triamcinolone  cream (KENALOG ) 0.1 % Apply 1 application topically 3 (three) times daily. 06/30/17   Triplett, Madelin, PA-C     Allergies    Patient has no known allergies.   Review of Systems   Review of Systems Please see HPI for pertinent positives and negatives  Physical Exam BP (!) 129/92   Pulse 83   Temp 98.1 F (36.7 C)   Resp 16   SpO2 98%   Physical Exam Vitals and nursing note reviewed.  HENT:     Head: Normocephalic.     Nose: Nose normal.     Mouth/Throat:     Comments: Poor dentition, no tender teeth, soft tissue swelling without fluctuance to the L mandible Eyes:     Extraocular Movements: Extraocular movements intact.  Pulmonary:     Effort: Pulmonary effort is normal.  Musculoskeletal:        General: Normal range of motion.     Cervical back: Neck supple.  Skin:    Findings: No rash (on exposed skin).  Neurological:     Mental Status: He is alert and  oriented to person, place, and time.  Psychiatric:        Mood and Affect: Mood normal.     ED Results / Procedures / Treatments   EKG None  Procedures Procedures  Medications Ordered in the ED Medications  clindamycin  (CLEOCIN ) capsule 300 mg (has no administration in time range)    Initial Impression and Plan  Patient here with L facial swelling, likely from dental infection, no signs of systemic or deep tissue infection. He was prescribed PCN from an UC about 2 weeks ago but has only been taking it sporadically. Will give Rx for clinda, recommend dental follow up. RTED for any worsening.   ED Course       MDM Rules/Calculators/A&P Medical Decision Making Problems Addressed: Dental infection: acute illness or injury  Risk Prescription drug management.     Final Clinical Impression(s) / ED Diagnoses Final diagnoses:  Dental infection    Rx / DC Orders ED Discharge Orders          Ordered    clindamycin  (CLEOCIN ) 150 MG capsule  4 times daily        06/08/24 0320             Roselyn Carlin NOVAK, MD 06/08/24 [phone]

## 2024-09-08 ENCOUNTER — Emergency Department (HOSPITAL_COMMUNITY)
Admission: EM | Admit: 2024-09-08 | Discharge: 2024-09-08 | Disposition: A | Discharge status: Home / Self Care | Payer: Self-pay | Attending: Emergency Medicine | Admitting: Emergency Medicine

## 2024-09-08 DIAGNOSIS — K0889 Other specified disorders of teeth and supporting structures: Secondary | ICD-10-CM | POA: Insufficient documentation

## 2024-09-08 MED ORDER — PENICILLIN V POTASSIUM 250 MG PO TABS
500.0000 mg | ORAL_TABLET | Freq: Once | ORAL | Status: AC
  Administered 2024-09-08: 500 mg via ORAL
  Filled 2024-09-08: qty 2

## 2024-09-08 MED ORDER — KETOROLAC TROMETHAMINE 30 MG/ML IJ SOLN
30.0000 mg | Freq: Once | INTRAMUSCULAR | Status: AC
Start: 2024-09-08 — End: 2024-09-08
  Administered 2024-09-08: 30 mg via INTRAMUSCULAR
  Filled 2024-09-08: qty 1

## 2024-09-08 MED ORDER — IBUPROFEN 600 MG PO TABS: 600.0000 mg | ORAL_TABLET | Freq: Four times a day (QID) | ORAL | 0 refills | Status: AC | PRN

## 2024-09-08 MED ORDER — PENICILLIN V POTASSIUM 500 MG PO TABS: 500.0000 mg | ORAL_TABLET | Freq: Four times a day (QID) | ORAL | 0 refills | Status: AC

## 2024-09-08 NOTE — ED Triage Notes (Signed)
 Pt arrives via POV with c/o R lower dental pain starting a couple weeks ago. Denies any recent dental procedures.

## 2024-09-08 NOTE — ED Notes (Signed)
 ED Provider at bedside.

## 2024-09-08 NOTE — ED Provider Notes (Signed)
 [place] [facility] AT [facility] Provider Note   CSN: [id number] Arrival date & time: 09/08/24  9742     Patient presents with: Dental Pain   Steven Woodward is a 28 y.o. male.   HPI     This is a 28 year old male who presents with dental pain.  Patient reports 2-week history of worsening pain right lower gumline.  He does not have a dentist.  Has not taken anything for the pain.  No fevers.  No difficulty swallowing.  Prior to Admission medications   Medication Sig Start Date End Date Taking? Authorizing Provider  ibuprofen  (ADVIL ) 600 MG tablet Take 1 tablet (600 mg total) by mouth every 6 (six) hours as needed. 09/08/24  Yes Celines Femia, Charmaine FALCON, MD  penicillin v potassium (VEETID) 500 MG tablet Take 1 tablet (500 mg total) by mouth 4 (four) times daily for 10 days. 09/08/24 09/18/24 Yes Maryana Pittmon, Charmaine FALCON, MD  clindamycin  (CLEOCIN ) 150 MG capsule Take 2 capsules (300 mg total) by mouth 4 (four) times daily. 06/08/24   Roselyn Carlin NOVAK, MD  cyclobenzaprine  (FLEXERIL ) 10 MG tablet Take 1 tablet (10 mg total) by mouth 2 (two) times daily as needed for muscle spasms. 06/05/20   Alvia Corean CROME, FNP  ibuprofen  (ADVIL ) 800 MG tablet Take 1 tablet (800 mg total) by mouth every 8 (eight) hours as needed for moderate pain. 06/05/20   Alvia Corean CROME, FNP  triamcinolone  cream (KENALOG ) 0.1 % Apply 1 application topically 3 (three) times daily. 06/30/17   Triplett, Tammy, PA-C    Allergies: Patient has no known allergies.    Review of Systems  Constitutional:  Negative for fever.  HENT:  Positive for dental problem.   All other systems reviewed and are negative.   Updated Vital Signs BP (!) 126/91   Pulse 61   Temp 97.9 F (36.6 C) (Oral)   Resp 17   SpO2 97%   Physical Exam Vitals and nursing note reviewed.  Constitutional:      Appearance: He is well-developed. He is not ill-appearing.  HENT:     Head: Normocephalic and atraumatic.      Mouth/Throat:     Comments: Generally poor dentition, multiple dental caries and missing teeth noted, no obvious abscess, there is some tenderness along the gumline right lower gum, no trismus or fullness noted under the tongue Eyes:     Pupils: Pupils are equal, round, and reactive to light.  Cardiovascular:     Rate and Rhythm: Normal rate and regular rhythm.  Pulmonary:     Effort: Pulmonary effort is normal. No respiratory distress.  Musculoskeletal:     Cervical back: Neck supple.  Lymphadenopathy:     Cervical: No cervical adenopathy.  Skin:    General: Skin is warm and dry.  Neurological:     Mental Status: He is alert and oriented to person, place, and time.  Psychiatric:        Mood and Affect: Mood normal.     (all labs ordered are listed, but only abnormal results are displayed) Labs Reviewed - No data to display  EKG: None  Radiology: No results found.   Procedures   Medications Ordered in the ED  penicillin v potassium (VEETID) tablet 500 mg (has no administration in time range)  ketorolac (TORADOL) 30 MG/ML injection 30 mg (has no administration in time range)  Medical Decision Making Risk Prescription drug management.   This patient presents to the ED for concern of dental pain, this involves an extensive number of treatment options, and is a complaint that carries with it a high risk of complications and morbidity.  I considered the following differential and admission for this acute, potentially life threatening condition.  The differential diagnosis includes underlying pathology fracture, caries, less likely deep space infection  MDM:    This is a 28 year old male who presents with dental pain.  Nontoxic and vital signs are deep space infection.  Suspect underlying infection.  No drainable abscess.  Will start on penicillin and ibuprofen .  Patient provided with dental resources.  (Labs, imaging,  consults)  Labs: I Ordered, and personally interpreted labs.  The pertinent results include: None  Imaging Studies ordered: I ordered imaging studies including none I independently visualized and interpreted imaging. I agree with the radiologist interpretation  Additional history obtained from chart review.  External records from outside source obtained and reviewed including prior evaluations  Cardiac Monitoring: The patient was not maintained on a cardiac monitor.  If on the cardiac monitor, I personally viewed and interpreted the cardiac monitored which showed an underlying rhythm of: N/A  Reevaluation: After the interventions noted above, I reevaluated the patient and found that they have :stayed the same  Social Determinants of Health:  lives independently  Disposition: Discharge  Co morbidities that complicate the patient evaluation No past medical history on file.   Medicines Meds ordered this encounter  Medications   penicillin v potassium (VEETID) tablet 500 mg   ketorolac (TORADOL) 30 MG/ML injection 30 mg   penicillin v potassium (VEETID) 500 MG tablet    Sig: Take 1 tablet (500 mg total) by mouth 4 (four) times daily for 10 days.    Dispense:  40 tablet    Refill:  0   ibuprofen  (ADVIL ) 600 MG tablet    Sig: Take 1 tablet (600 mg total) by mouth every 6 (six) hours as needed.    Dispense:  30 tablet    Refill:  0    I have reviewed the patients home medicines and have made adjustments as needed  Problem List / ED Course: Problem List Items Addressed This Visit   None Visit Diagnoses       Pain, dental    -  Primary                Final diagnoses:  Pain, dental    ED Discharge Orders          Ordered    penicillin v potassium (VEETID) 500 MG tablet  4 times daily        09/08/24 0327    ibuprofen  (ADVIL ) 600 MG tablet  Every 6 hours PRN        09/08/24 0327               Bari Charmaine FALCON, MD 09/08/24 [phone]

## 2024-09-08 NOTE — Discharge Instructions (Addendum)
 You were seen today for dental pain.  It is very importantly follow-up with a dentist as you have multiple teeth that likely need to be pulled.  Often times dental pain reflects an underlying infection.  Take antibiotics as prescribed.  See resources provided.
